# Patient Record
Sex: Female | Born: 1950 | Race: White | Hispanic: No | Marital: Single | Smoking: Never smoker
Health system: Southern US, Community
[De-identification: ages and names within clinical notes are randomized; demographics above are authoritative.]

## PROBLEM LIST (undated history)

## (undated) DIAGNOSIS — J45909 Unspecified asthma, uncomplicated: Secondary | ICD-10-CM

## (undated) DIAGNOSIS — G43909 Migraine, unspecified, not intractable, without status migrainosus: Secondary | ICD-10-CM

## (undated) HISTORY — PX: APPENDECTOMY: SHX54

## (undated) HISTORY — PX: OVARIAN CYST REMOVAL: SHX89

---

## 2015-10-13 ENCOUNTER — Encounter (HOSPITAL_COMMUNITY): Payer: Self-pay

## 2015-10-13 ENCOUNTER — Emergency Department (HOSPITAL_COMMUNITY): Payer: Federal, State, Local not specified - PPO

## 2015-10-13 ENCOUNTER — Emergency Department (HOSPITAL_COMMUNITY)
Admission: EM | Admit: 2015-10-13 | Discharge: 2015-10-13 | Disposition: A | Discharge status: Home / Self Care | Payer: Federal, State, Local not specified - PPO | Attending: Emergency Medicine | Admitting: Emergency Medicine

## 2015-10-13 DIAGNOSIS — R0602 Shortness of breath: Secondary | ICD-10-CM | POA: Diagnosis present

## 2015-10-13 DIAGNOSIS — J45901 Unspecified asthma with (acute) exacerbation: Secondary | ICD-10-CM | POA: Diagnosis not present

## 2015-10-13 HISTORY — DX: Migraine, unspecified, not intractable, without status migrainosus: G43.909

## 2015-10-13 HISTORY — DX: Unspecified asthma, uncomplicated: J45.909

## 2015-10-13 LAB — BASIC METABOLIC PANEL
Anion gap: 11 (ref 5–15)
BUN: 8 mg/dL (ref 6–20)
CO2: 23 mmol/L (ref 22–32)
CREATININE: 0.68 mg/dL (ref 0.44–1.00)
Calcium: 9.5 mg/dL (ref 8.9–10.3)
Chloride: 103 mmol/L (ref 101–111)
GFR calc Af Amer: >60 mL/min (ref >60)
Glucose, Bld: 135 mg/dL — ABNORMAL HIGH (ref 65–99)
Potassium: 3.5 mmol/L (ref 3.5–5.1)
SODIUM: 137 mmol/L (ref 135–145)

## 2015-10-13 LAB — CBC WITH DIFFERENTIAL/PLATELET
BASOS ABS: 0 10*3/uL (ref 0.0–0.1)
Basophils Relative: 0 %
EOS ABS: 0 10*3/uL (ref 0.0–0.7)
EOS PCT: 0 %
HCT: 43.2 % (ref 36.0–46.0)
Hemoglobin: 14.2 g/dL (ref 12.0–15.0)
LYMPHS ABS: 0.6 10*3/uL — AB (ref 0.7–4.0)
Lymphocytes Relative: 8 %
MCH: 31.5 pg (ref 26.0–34.0)
MCHC: 32.9 g/dL (ref 30.0–36.0)
MCV: 95.8 fL (ref 78.0–100.0)
Monocytes Absolute: 0.3 10*3/uL (ref 0.1–1.0)
Monocytes Relative: 3 %
Neutro Abs: 7 10*3/uL (ref 1.7–7.7)
Neutrophils Relative %: 89 %
PLATELETS: 304 10*3/uL (ref 150–400)
RBC: 4.51 MIL/uL (ref 3.87–5.11)
RDW: 14 % (ref 11.5–15.5)
WBC: 7.9 10*3/uL (ref 4.0–10.5)

## 2015-10-13 LAB — D-DIMER, QUANTITATIVE (NOT AT ARMC): D DIMER QUANT: 0.46 ug{FEU}/mL (ref 0.00–0.50)

## 2015-10-13 MED ORDER — LEVALBUTEROL TARTRATE 45 MCG/ACT IN AERO: 2.0000 | INHALATION_SPRAY | RESPIRATORY_TRACT | 0 refills | Status: AC | PRN

## 2015-10-13 MED ORDER — PREDNISONE 10 MG (21) PO TBPK: 10.0000 mg | ORAL_TABLET | Freq: Every day | ORAL | 0 refills | Status: AC

## 2015-10-13 MED ORDER — ALBUTEROL SULFATE (2.5 MG/3ML) 0.083% IN NEBU
5.0000 mg | INHALATION_SOLUTION | Freq: Once | RESPIRATORY_TRACT | Status: AC
  Administered 2015-10-13: 5 mg via RESPIRATORY_TRACT
  Filled 2015-10-13: qty 6

## 2015-10-13 MED ORDER — IPRATROPIUM BROMIDE 0.02 % IN SOLN
0.5000 mg | Freq: Once | RESPIRATORY_TRACT | Status: AC
  Administered 2015-10-13: 0.5 mg via RESPIRATORY_TRACT
  Filled 2015-10-13: qty 2.5

## 2015-10-13 NOTE — ED Triage Notes (Signed)
PT C/O ASTHMA FLARE UP SINCE LAST NIGHT. PT STS SHE JUST FLEW IN FROM [place] AND IS EXHAUSTED. PT TOOK PREDNISONE 60MG  TODAY AND USED HER INHALER W/O RELIEF.

## 2015-10-13 NOTE — Discharge Instructions (Signed)
Read the information below.  Use the prescribed medication as directed.  Please discuss all new medications with your pharmacist.  You may return to the Emergency Department at any time for worsening condition or any new symptoms that concern you.   If you develop worsening shortness of breath, uncontrolled wheezing, severe chest pain, or fevers despite using tylenol and/or ibuprofen, return for a recheck.     °

## 2015-10-13 NOTE — ED Provider Notes (Signed)
[facility]-EMERGENCY DEPT Provider Note   CSN: [id number] Arrival date & time: 10/13/15  1629  By signing my name below, I, Octavia Heir, attest that this documentation has been prepared under the direction and in the presence of [facility], PA-C.  Electronically Signed: Octavia Heir, ED Scribe. 10/13/15. 5:51 PM.    History   Chief Complaint Chief Complaint  Patient presents with  . Asthma    The history is provided by the patient. No language interpreter was used.   HPI Comments: Rebecca Madden is a 65 y.o. female who has a PMhx of asthma presents to the Emergency Department complaining of an asthma flare up that started last night. Pt has been having associated cough, shortness of breath, and pain (tightness) with deep inhalation. Pt says she has not had an asthma attack in ~ 5 years. She is currently on prednisone prn and dulera for her asthma. Pt is from [place] and reports that she has been flying across the country a lot. Pt flew in from [place] 3 days ago. She has been using her rescue inhaler BID since Tuesday and 60 mg of expired prednisone to alleviate her symptoms without relief. Denies fever, sore throat, or facial pain.  Denies unilateral leg swelling.    Past Medical History:  Diagnosis Date  . Asthma   . Migraine     There are no active problems to display for this patient.   Past Surgical History:  Procedure Laterality Date  . APPENDECTOMY    . OVARIAN CYST REMOVAL      OB History    No data available       Home Medications    Prior to Admission medications   Medication Sig Start Date End Date Taking? Authorizing Provider  levalbuterol [facility] HFA) 45 MCG/ACT inhaler Inhale 2 puffs into the lungs every 4 (four) hours as needed for wheezing. 10/13/15   Trixie Dredge, PA-C  predniSONE (STERAPRED UNI-PAK 21 TAB) 10 MG (21) TBPK tablet Take 1 tablet (10 mg total) by mouth daily. Day 1: take 6 tabs.  Day 2: 5 tabs  Day 3: 4 tabs  Day 4: 3 tabs  Day 5: 2 tabs   Day 6: 1 tab 10/13/15   Trixie Dredge, PA-C    Family History History reviewed. No pertinent family history.  Social History Social History  Substance Use Topics  . Smoking status: Never Smoker  . Smokeless tobacco: Never Used  . Alcohol use Yes     Comment: SOCIAL     Allergies   Molds & smuts; Penicillins; and Wheat bran   Review of Systems Review of Systems  Constitutional: Negative for fever.  HENT: Negative for sore throat and trouble swallowing.   Respiratory: Positive for cough and shortness of breath.   Cardiovascular: Positive for chest pain. Negative for leg swelling.  Gastrointestinal: Negative for abdominal pain.  Skin: Negative for rash.  Allergic/Immunologic: Negative for immunocompromised state.  Hematological: Does not bruise/bleed easily.  Psychiatric/Behavioral: Negative for self-injury.     Physical Exam Updated Vital Signs BP 152/85 (BP Location: Left Arm)   Pulse 106   Temp 98.6 F (37 C) (Oral)   Resp 21   Ht 5\' 7"  (1.702 m)   Wt 99.8 kg   SpO2 100%   BMI 34.46 kg/m   Physical Exam  Constitutional: She appears well-developed and well-nourished. No distress.  HENT:  Head: Normocephalic and atraumatic.  Neck: Neck supple.  Cardiovascular: Regular rhythm.  Tachycardia present.   Pulmonary/Chest: Effort  normal. No respiratory distress. She has no wheezes. She has no rales.  Intermittent dry cough  Abdominal: Soft. There is no tenderness.  Musculoskeletal: She exhibits no edema.  Calves soft, nontender, no edema  Neurological: She is alert.  Skin: She is not diaphoretic.  Psychiatric: She has a normal mood and affect. Her behavior is normal.  Nursing note and vitals reviewed.    ED Treatments / Results  DIAGNOSTIC STUDIES: Oxygen Saturation is 94% on RA, normal by my interpretation.  COORDINATION OF CARE:  5:48 PM Discussed treatment plan with pt at bedside and pt agreed to plan.  Labs (all labs ordered are listed, but only  abnormal results are displayed) Labs Reviewed  BASIC METABOLIC PANEL - Abnormal; Notable for the following:       Result Value   Glucose, Bld 135 (*)    All other components within normal limits  CBC WITH DIFFERENTIAL/PLATELET - Abnormal; Notable for the following:    Lymphs Abs 0.6 (*)    All other components within normal limits  D-DIMER, QUANTITATIVE (NOT AT [facility])    EKG  EKG Interpretation None      ED ECG REPORT   Date: 10/13/2015  Rate: 112  Rhythm: sinus tachycardia  QRS Axis: normal  Intervals: normal  ST/T Wave abnormalities: nonspecific T wave changes  Conduction Disutrbances:none  Narrative Interpretation:   Old EKG Reviewed: none available  I have personally reviewed the EKG tracing and agree with the computerized printout as noted.   Radiology Dg Chest 2 View  Result Date: 10/13/2015 CLINICAL DATA:  Cough and shortness of breath for 1 day. EXAM: CHEST  2 VIEW COMPARISON:  None. FINDINGS: The heart is borderline enlarged. There is mild tortuosity of the thoracic aorta. The lungs are clear. Mild eventration of the right hemidiaphragm. No pleural effusion. The bony thorax is intact. IMPRESSION: No acute cardiopulmonary findings. Electronically Signed   By: Rudie MeyerP.  Gallerani M.D.   On: 10/13/2015 17:40    Procedures Procedures (including critical care time)  Medications Ordered in ED Medications  albuterol (PROVENTIL) (2.5 MG/3ML) 0.083% nebulizer solution 5 mg (5 mg Nebulization Given 10/13/15 1752)  albuterol (PROVENTIL) (2.5 MG/3ML) 0.083% nebulizer solution 5 mg (5 mg Nebulization Given 10/13/15 2008)  ipratropium (ATROVENT) nebulizer solution 0.5 mg (0.5 mg Nebulization Given 10/13/15 2008)     Initial Impression / Assessment and Plan / ED Course  I have reviewed the triage vital signs and the nursing notes.  Pertinent labs & imaging results that were available during my care of the patient were reviewed by me and considered in my medical decision making  (see chart for details).  Clinical Course   Afebrile, nontoxic patient with cough, SOB, chest tightness that feels like prior asthma exacerbations.  Given recent long flights, d-dimer ordered, which is negative.  Pt feeling better after breathing treatments.  Labs remarkable only for slight elevation in blood glucose.  CXR negative.  EKG nonischemic.   D/C home with refill xopenex, prednisone dose pack, P[place] follow [place] in [place].  Discussed result, findings, treatment, and follow up  with patient.  Pt given return precautions.  Pt verbalizes understanding and agrees with plan.       I personally performed the services described in this documentation, which was scribed in my presence. The recorded information has been reviewed and is accurate.  Final Clinical Impressions(s) / ED Diagnoses   Final diagnoses:  Asthma exacerbation    New Prescriptions Discharge Medication List as of 10/13/2015  8:56 PM    START taking these medications   Details  levalbuterol (XOPENEX HFA) 45 MCG/ACT inhaler Inhale 2 puffs into the lungs every 4 (four) hours as needed for wheezing., Starting Thu 10/13/2015, Print    predniSONE (STERAPRED UNI-PAK 21 TAB) 10 MG (21) TBPK tablet Take 1 tablet (10 mg total) by mouth daily. Day 1: take 6 tabs.  Day 2: 5 tabs  Day 3: 4 tabs  Day 4: 3 tabs  Day 5: 2 tabs  Day 6: 1 tab, Starting Thu 10/13/2015, Print         [place], PA-C 10/13/15 2120    Linwood Dibbles, MD 10/14/15 [id number]

## 2018-03-14 IMAGING — CR DG CHEST 2V
2 series · 2 of 2 positions shown · non-contrast
Comparison: None.

CLINICAL DATA: Cough and shortness of breath for 1 day.

EXAM:
CHEST  2 VIEW

[w chest pa]
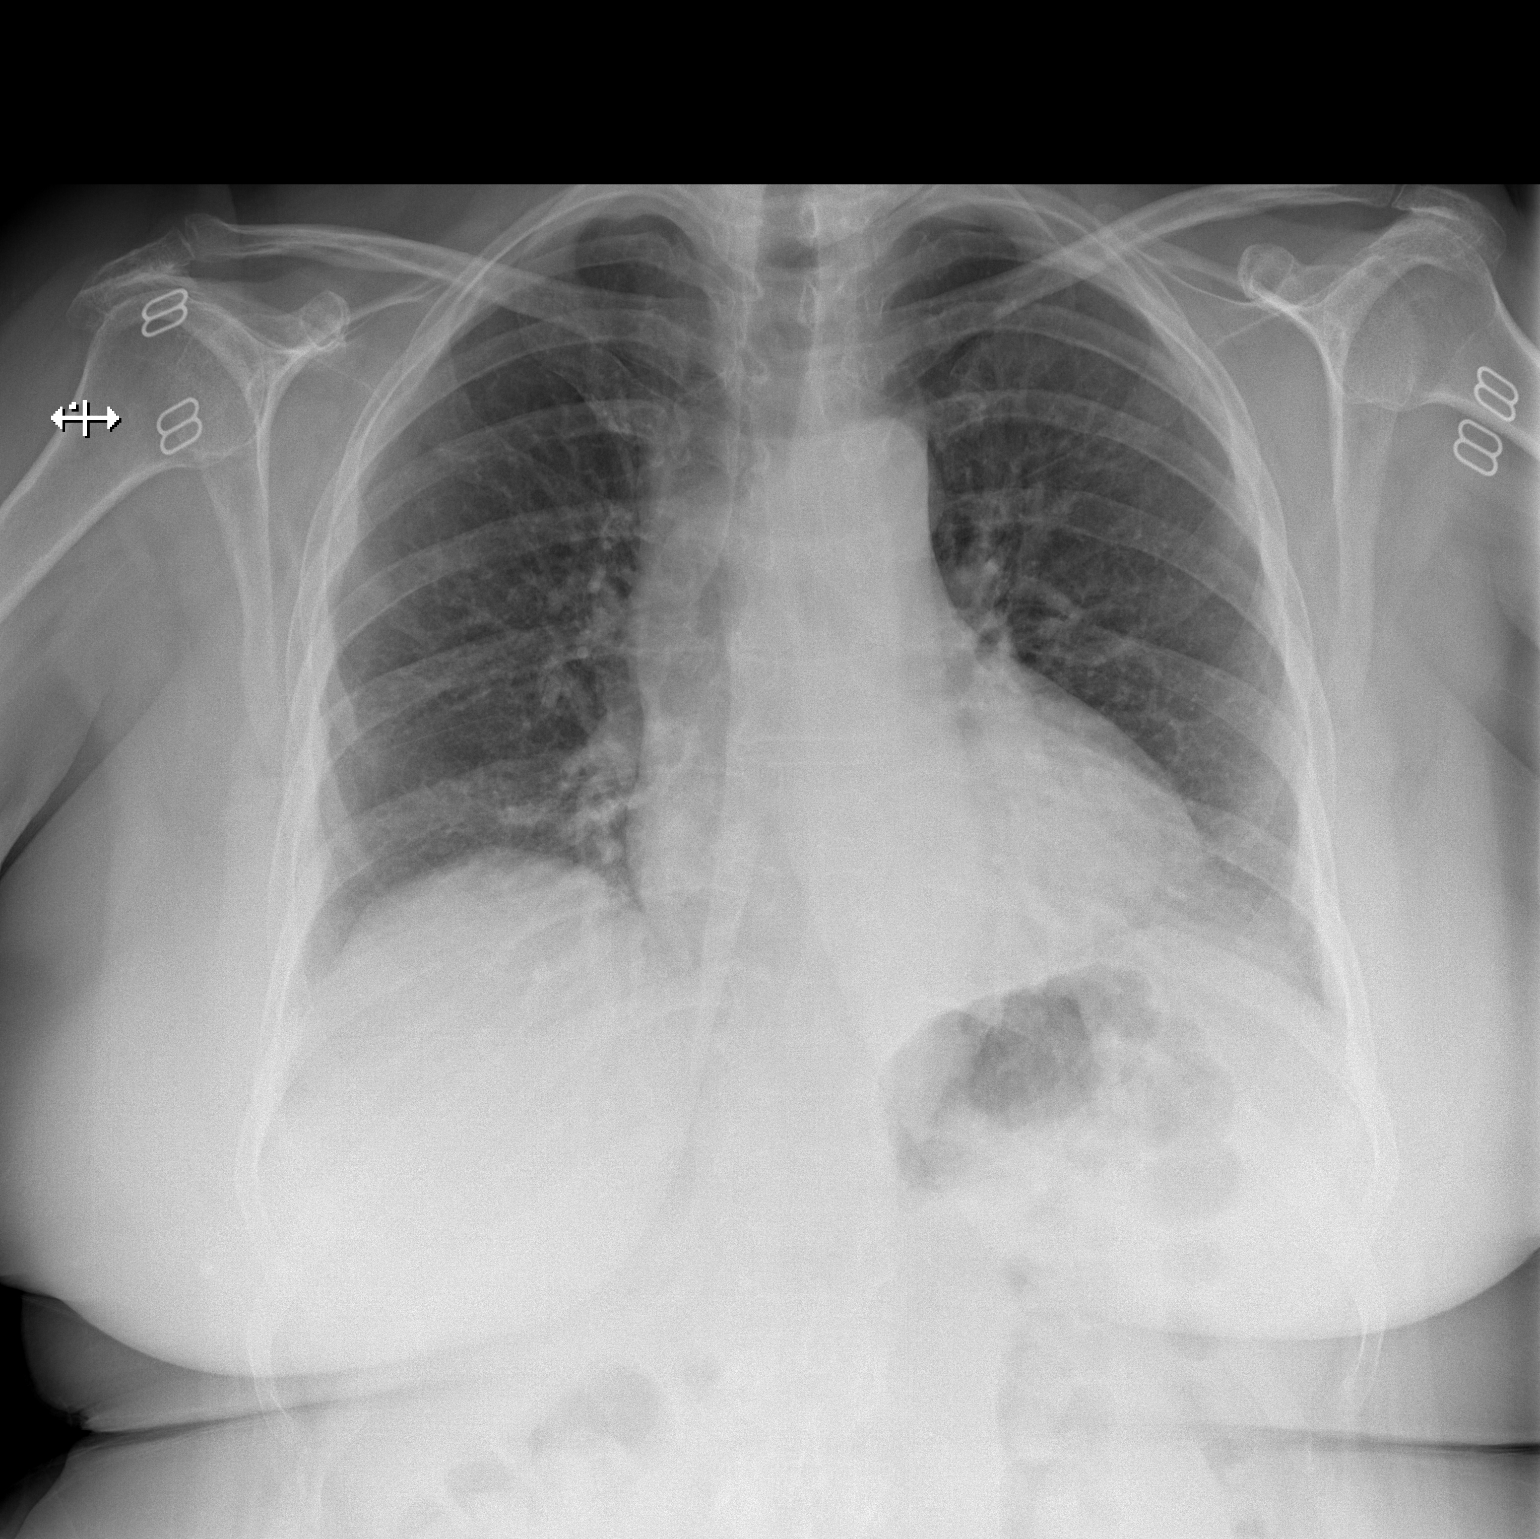

[w chest lat]
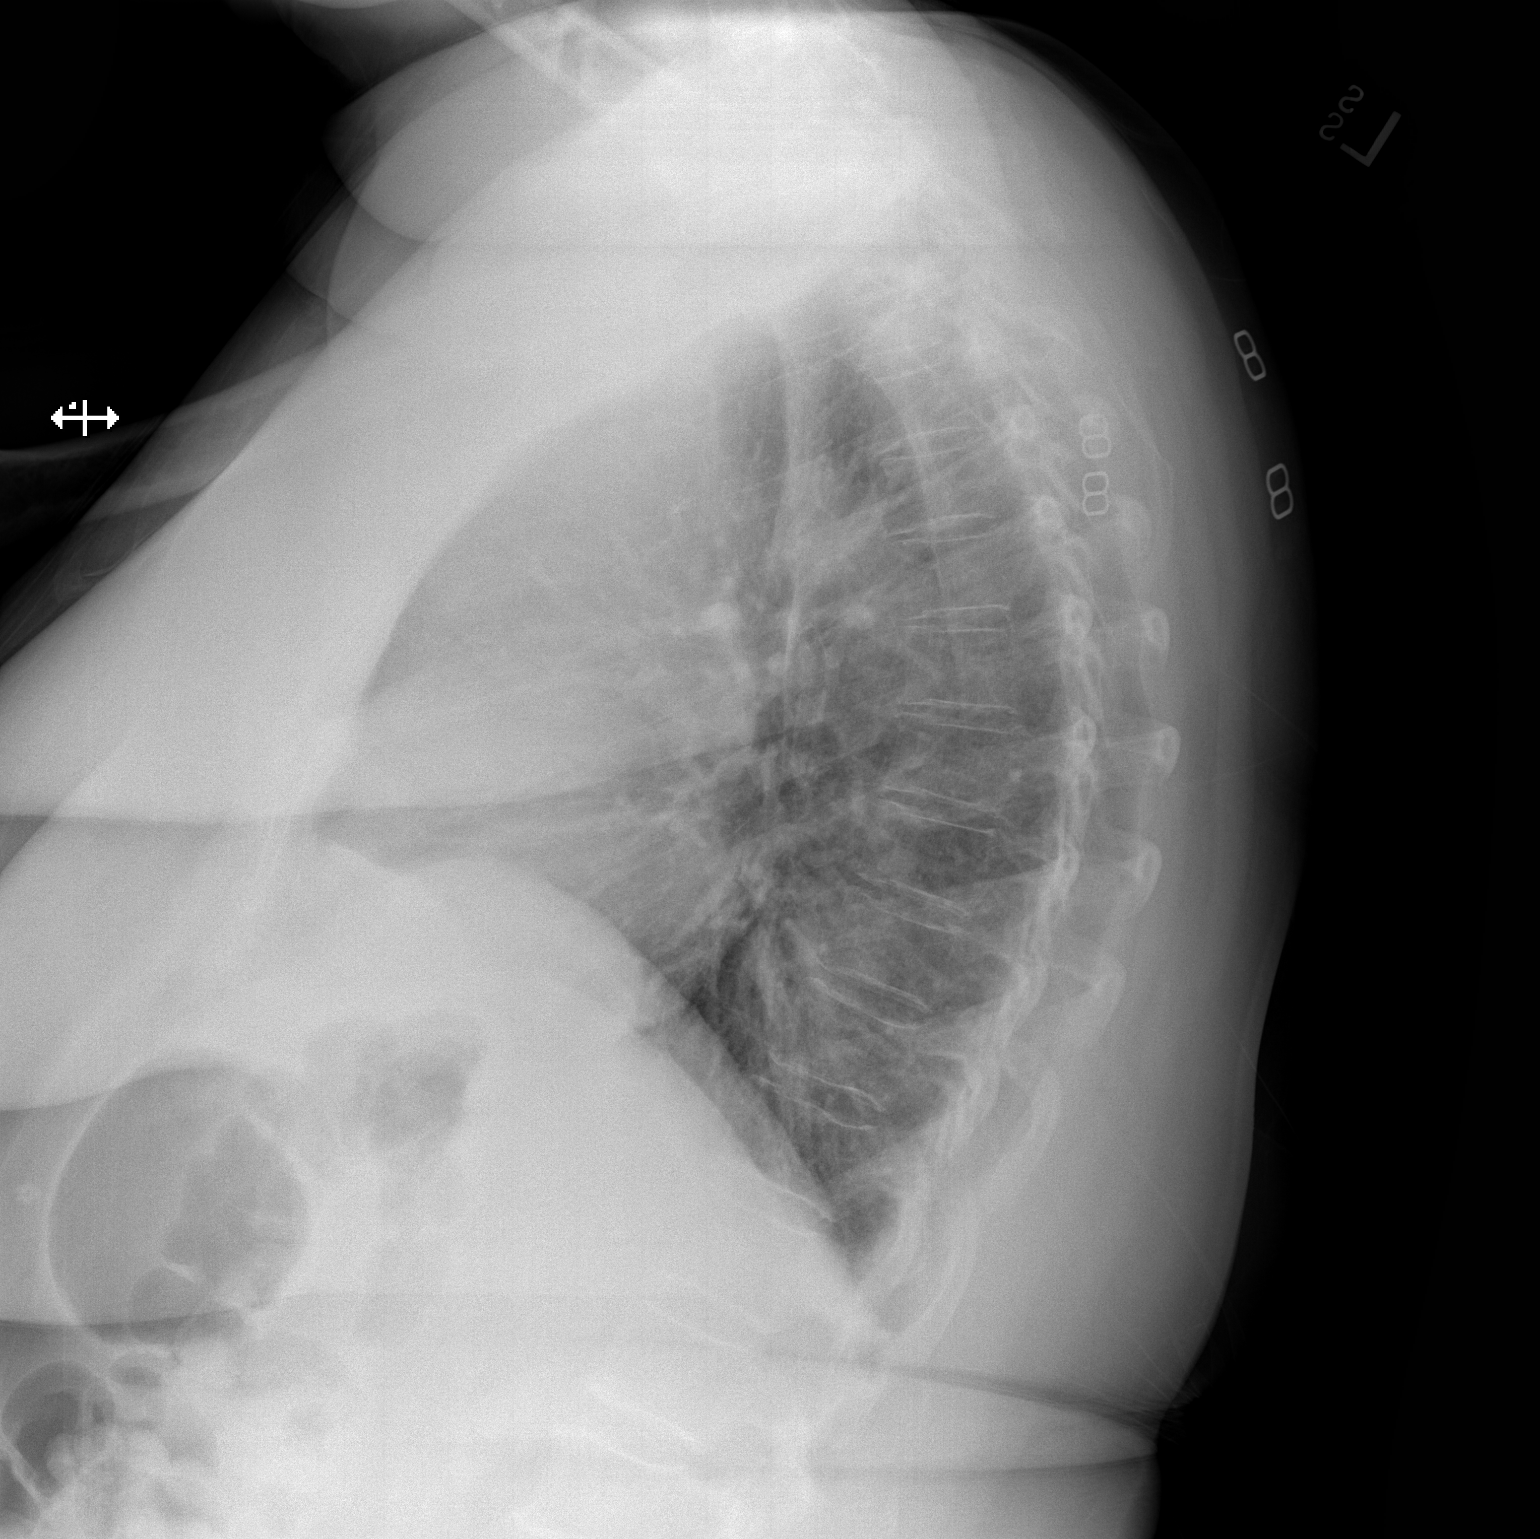

[2 of 2 positions shown; findings below may reference images not displayed]

FINDINGS: The heart is borderline enlarged. There is mild tortuosity of the
thoracic aorta. The lungs are clear. Mild eventration of the right
hemidiaphragm. No pleural effusion. The bony thorax is intact.
IMPRESSION: No acute cardiopulmonary findings.
